# Patient Record
Sex: Female | Born: 1940 | Race: White | Hispanic: No | Marital: Married | Smoking: Never smoker
Health system: Southern US, Community
[De-identification: ages and names within clinical notes are randomized; demographics above are authoritative.]

## PROBLEM LIST (undated history)

## (undated) DIAGNOSIS — E079 Disorder of thyroid, unspecified: Secondary | ICD-10-CM

## (undated) DIAGNOSIS — I10 Essential (primary) hypertension: Secondary | ICD-10-CM

---

## 2016-09-26 ENCOUNTER — Emergency Department (HOSPITAL_COMMUNITY)
Admission: EM | Admit: 2016-09-26 | Discharge: 2016-09-26 | Disposition: A | Discharge status: Home / Self Care | Payer: Medicare PPO | Attending: Emergency Medicine | Admitting: Emergency Medicine

## 2016-09-26 ENCOUNTER — Emergency Department (HOSPITAL_COMMUNITY): Payer: Medicare PPO

## 2016-09-26 ENCOUNTER — Encounter (HOSPITAL_COMMUNITY): Payer: Self-pay | Admitting: *Deleted

## 2016-09-26 DIAGNOSIS — W109XXA Fall (on) (from) unspecified stairs and steps, initial encounter: Secondary | ICD-10-CM | POA: Diagnosis not present

## 2016-09-26 DIAGNOSIS — S0990XA Unspecified injury of head, initial encounter: Secondary | ICD-10-CM | POA: Diagnosis present

## 2016-09-26 DIAGNOSIS — Y9289 Other specified places as the place of occurrence of the external cause: Secondary | ICD-10-CM | POA: Insufficient documentation

## 2016-09-26 DIAGNOSIS — Z79899 Other long term (current) drug therapy: Secondary | ICD-10-CM | POA: Diagnosis not present

## 2016-09-26 DIAGNOSIS — S0083XA Contusion of other part of head, initial encounter: Secondary | ICD-10-CM | POA: Diagnosis not present

## 2016-09-26 DIAGNOSIS — Y999 Unspecified external cause status: Secondary | ICD-10-CM | POA: Diagnosis not present

## 2016-09-26 DIAGNOSIS — S80211A Abrasion, right knee, initial encounter: Secondary | ICD-10-CM | POA: Diagnosis not present

## 2016-09-26 DIAGNOSIS — I10 Essential (primary) hypertension: Secondary | ICD-10-CM | POA: Insufficient documentation

## 2016-09-26 DIAGNOSIS — Y939 Activity, unspecified: Secondary | ICD-10-CM | POA: Insufficient documentation

## 2016-09-26 HISTORY — DX: Essential (primary) hypertension: I10

## 2016-09-26 HISTORY — DX: Disorder of thyroid, unspecified: E07.9

## 2016-09-26 MED ORDER — ACETAMINOPHEN 500 MG PO TABS
1000.0000 mg | ORAL_TABLET | Freq: Once | ORAL | Status: AC
  Administered 2016-09-26: 1000 mg via ORAL
  Filled 2016-09-26: qty 2

## 2016-09-26 MED ORDER — NAPROXEN 500 MG PO TABS
500.0000 mg | ORAL_TABLET | Freq: Once | ORAL | Status: AC
  Administered 2016-09-26: 500 mg via ORAL
  Filled 2016-09-26: qty 1

## 2016-09-26 NOTE — ED Provider Notes (Signed)
[facility]-EMERGENCY DEPT Provider Note   CSN: [id number] Arrival date & time: 09/26/16  1559     History   Chief Complaint Chief Complaint  Patient presents with  . Head Injury    HPI Heather Sheppard is a 75 y.o. female.   Head Injury   Incident onset: 230pm. She came to the ER via walk-in. The injury mechanism was a fall. There was no loss of consciousness. There was no blood loss. The pain is moderate. The pain has been constant since the injury. She was found conscious by EMS personnel. She has tried ice for the symptoms. The treatment provided mild relief.    Past Medical History:  Diagnosis Date  . Hypertension   . Thyroid disease     There are no active problems to display for this patient.   History reviewed. No pertinent surgical history.  OB History    No data available       Home Medications    Prior to Admission medications   Medication Sig Start Date End Date Taking? Authorizing Provider  buPROPion (WELLBUTRIN SR) 200 MG 12 hr tablet Take 200 mg by mouth 2 (two) times daily.   Yes Historical Provider, MD  levothyroxine (SYNTHROID, LEVOTHROID) 112 MCG tablet Take 112 mcg by mouth daily before breakfast.   Yes Historical Provider, MD  metoprolol succinate (TOPROL-XL) 50 MG 24 hr tablet Take 50 mg by mouth every evening. Take with or immediately following a meal.    Yes Historical Provider, MD  mirabegron ER (MYRBETRIQ) 25 MG TB24 tablet Take 25 mg by mouth every evening.   Yes Historical Provider, MD  tiZANidine (ZANAFLEX) 4 MG tablet Take 4 mg by mouth every 8 (eight) hours as needed for muscle spasms.   Yes Historical Provider, MD    Family History No family history on file.  Social History Social History  Substance Use Topics  . Smoking status: Never Smoker  . Smokeless tobacco: Never Used  . Alcohol use Yes     Allergies   Morphine and related; Protonix [pantoprazole sodium]; Sulfa antibiotics; Tagamet [cimetidine]; and Zantac [ranitidine  hcl]   Review of Systems Review of Systems  All other systems reviewed and are negative.    Physical Exam Updated Vital Signs BP 155/76 (BP Location: Left Arm)   Pulse 76   Temp 97.6 F (36.4 C) (Oral)   Resp 20   SpO2 98%   Physical Exam  Constitutional: She is oriented to person, place, and time. She appears well-developed and well-nourished. No distress.  HENT:  Head: Normocephalic. Head is with contusion.    Nose: Nose normal.  Tenderness over ledt zygomatic arch, upper and lower orbit with painless EOMI and no entrament  Eyes: Conjunctivae and EOM are normal. Pupils are equal, round, and reactive to light.  Neck: Neck supple. No tracheal deviation present.  Cardiovascular: Normal rate, regular rhythm and normal heart sounds.   Pulmonary/Chest: Effort normal. No respiratory distress.  Abdominal: Soft. She exhibits no distension.  Musculoskeletal: She exhibits no deformity.  Right knee abrasion  Neurological: She is alert and oriented to person, place, and time.  Skin: Skin is warm and dry.  Psychiatric: She has a normal mood and affect.     ED Treatments / Results  Labs (all labs ordered are listed, but only abnormal results are displayed) Labs Reviewed - No data to display  EKG  EKG Interpretation None       Radiology Ct Head Wo Contrast  Result Date:  09/26/2016 CLINICAL DATA:  Swollen bruise area to left thigh and forehead since falling going up the stairs today. EXAM: CT HEAD WITHOUT CONTRAST CT CERVICAL SPINE WITHOUT CONTRAST TECHNIQUE: Multidetector CT imaging of the head and cervical spine was performed following the standard protocol without intravenous contrast. Multiplanar CT image reconstructions of the cervical spine were also generated. COMPARISON:  None. FINDINGS: CT HEAD FINDINGS Brain: There is no evidence for acute hemorrhage, hydrocephalus, mass lesion, or abnormal extra-axial fluid collection. No definite CT evidence for acute infarction.  Diffuse loss of parenchymal volume is consistent with atrophy. Patchy low attenuation in the deep hemispheric and periventricular white matter is nonspecific, but likely reflects chronic microvascular ischemic demyelination. Vascular: Atherosclerotic calcification is visualized in the carotid arteries. No dense MCA sign. Major dural sinuses are unremarkable. Skull: No evidence for fracture. No worrisome lytic or sclerotic lesion. Sinuses/Orbits: Chronic mucosal disease is seen in the sphenoid sinuses remaining visualized paranasal sinuses and mastoid air cells are clear. Visualized portions of the globes and intraorbital fat are unremarkable. Other: None. CT CERVICAL SPINE FINDINGS Alignment: Straightening of the normal cervical lordosis noted. Skull base and vertebrae: Imaging from the skull base down to T2 shows no fracture. Soft tissues and spinal canal: No prevertebral fluid or swelling. No visible canal hematoma. Disc levels: Loss of disc height with endplate degeneration is seen at C5-6. Loss of disc height also visible at C6-7 in C4-5. 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 Upper chest: Biapical pleural-parenchymal scarring identified in the lungs. Other: None. IMPRESSION: 1. No acute intracranial abnormality. 2. Atrophy with chronic small vessel white matter ischemic disease. 3. Degenerative changes without fracture in the cervical spine. Electronically Signed   By: Kennith CenterEric  Mansell M.D.   On: 09/26/2016 19:25   Ct Cervical Spine Wo Contrast  Result Date: 09/26/2016 CLINICAL DATA:  Swollen bruise area to left thigh and forehead since falling going up the stairs today. EXAM: CT HEAD WITHOUT CONTRAST CT CERVICAL SPINE WITHOUT CONTRAST TECHNIQUE: Multidetector CT imaging of the head and cervical spine was performed following the standard protocol without intravenous contrast. Multiplanar CT image reconstructions of the cervical spine were also generated. COMPARISON:  None.  FINDINGS: CT HEAD FINDINGS Brain: There is no evidence for acute hemorrhage, hydrocephalus, mass lesion, or abnormal extra-axial fluid collection. No definite CT evidence for acute infarction. Diffuse loss of parenchymal volume is consistent with atrophy. Patchy low attenuation in the deep hemispheric and periventricular white matter is nonspecific, but likely reflects chronic microvascular ischemic demyelination. Vascular: Atherosclerotic calcification is visualized in the carotid arteries. No dense MCA sign. Major dural sinuses are unremarkable. Skull: No evidence for fracture. No worrisome lytic or sclerotic lesion. Sinuses/Orbits: Chronic mucosal disease is seen in the sphenoid sinuses remaining visualized paranasal sinuses and mastoid air cells are clear. Visualized portions of the globes and intraorbital fat are unremarkable. Other: None. CT CERVICAL SPINE FINDINGS Alignment: Straightening of the normal cervical lordosis noted. Skull base and vertebrae: Imaging from the skull base down to T2 shows no fracture. Soft tissues and spinal canal: No prevertebral fluid or swelling. No visible canal hematoma. Disc levels: Loss of disc height with endplate degeneration is seen at C5-6. Loss of disc height also visible at C6-7 in C4-5. 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 Upper chest: Biapical pleural-parenchymal scarring identified in the lungs. Other: None. IMPRESSION: 1. No acute intracranial abnormality. 2. Atrophy with chronic small vessel white matter ischemic disease. 3.  Degenerative changes without fracture in the cervical spine. Electronically Signed   By: Kennith CenterEric  Mansell M.D.   On: 09/26/2016 19:25   Ct Maxillofacial Wo Contrast  Result Date: 09/26/2016 CLINICAL DATA:  Fall on stairs today with left facial bruising. EXAM: CT MAXILLOFACIAL WITHOUT CONTRAST TECHNIQUE: Multidetector CT imaging of the maxillofacial structures was performed. Multiplanar CT image reconstructions  were also generated. A small metallic BB was placed on the right temple in order to reliably differentiate right from left. COMPARISON:  Head and cervical spine CT earlier this day at 1910 hour FINDINGS: Osseous: No facial bone fracture. The mandibles, nasal bone, zygomatic arches and pterygoid plates are intact. Normal TMJ alignment. Orbits: No orbital fracture. Left periorbital edema, both globes are intact. No orbital inflammation. Sinuses: Small fluid level in right maxillary sinus, mucosal thickening of left maxillary sinus. Fluid level with bubbly debris within the sphenoid sinus. Soft tissues: Left periorbital soft tissue edema and hematoma. No radiopaque foreign body. Limited intracranial: No change from dedicated head CT earlier this day. No acute abnormality. IMPRESSION: Left periorbital edema and hematoma. No orbital or facial bone fracture. Paranasal sinus disease. Electronically Signed   By: Rubye OaksMelanie  Ehinger M.D.   On: 09/26/2016 22:02    Procedures Procedures (including critical care time)  Medications Ordered in ED Medications - No data to display   Initial Impression / Assessment and Plan / ED Course  I have reviewed the triage vital signs and the nursing notes.  Pertinent labs & imaging results that were available during my care of the patient were reviewed by me and considered in my medical decision making (see chart for details).  Clinical Course     75 y.o. female presents with fall from standing onto concrete while playing with grandson. Large left facial contusion with associated hematoma and tenderness over zygoma/orbital rim. No associated fracture or ICH. Not anticoagulated. Ice and NSAIDs for definitive treatment to resolution. No signs of concussion clinically, Pt to monitor symptoms and slow return to activity.   Final Clinical Impressions(s) / ED Diagnoses   Final diagnoses:  Facial hematoma, initial encounter  Contusion of face, initial encounter    New  Prescriptions Discharge Medication List as of 09/26/2016 10:08 PM       Lyndal Pulleyaniel Kayelynn Abdou, MD 09/27/16 [phone]

## 2016-09-26 NOTE — ED Notes (Signed)
Documented in error CT Maxillofacial wo Contrast.

## 2016-09-26 NOTE — ED Triage Notes (Signed)
Pt has swollen, bruised area to left of eye and forehead since falling going up the stairs today. Pt denies loss of consciousness, use of blood thinners, blurred vision or headache

## 2018-08-15 IMAGING — CT CT HEAD W/O CM
3 of 8 series · 14 of 47 positions shown, 17 images · non-contrast
Comparison: None.

CLINICAL DATA: Swollen bruise area to left thigh and forehead since
falling going up the stairs today.

EXAM:
CT HEAD WITHOUT CONTRAST
CT CERVICAL SPINE WITHOUT CONTRAST
TECHNIQUE: Multidetector CT imaging of the head and cervical spine was
performed following the standard protocol without intravenous
contrast. Multiplanar CT image reconstructions of the cervical spine
were also generated.

[Series 9: coronal · coronal · 0.30mm/px · 3 of 68 slices shown]
[im 26/68  brain]
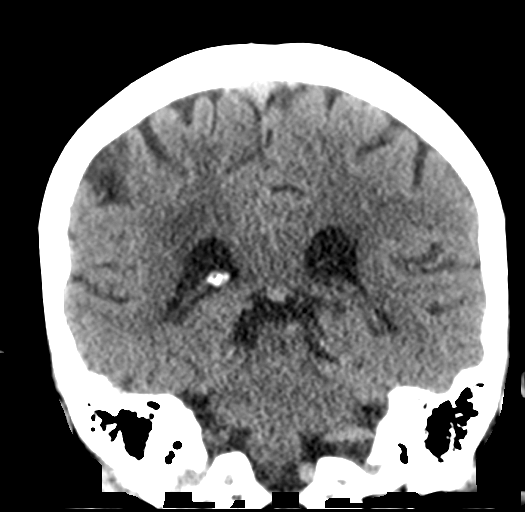
[im 34/68  brain]
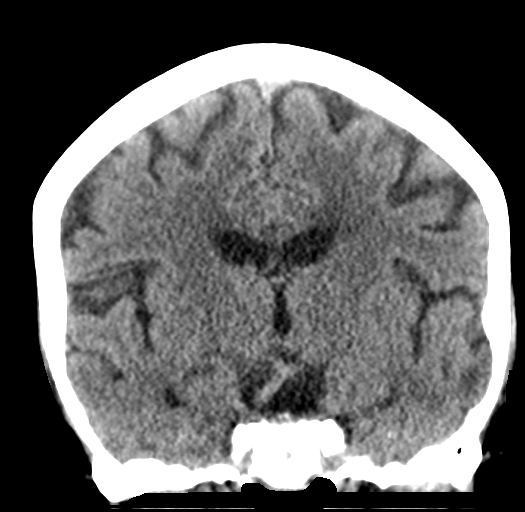
[im 42/68  brain]
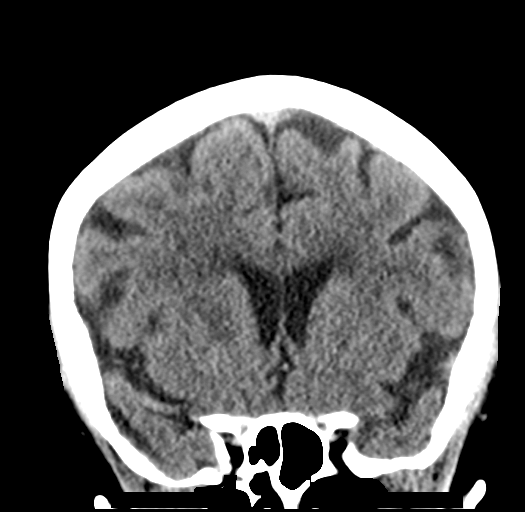

[Series 11: axial recon · axial · 0.23mm/px · z∈[-290,-148]mm · 9 of 98 slices shown, 12 images]
[im 10/98  brain]
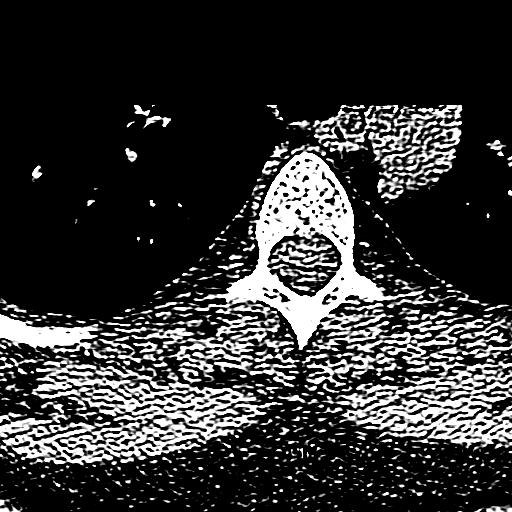
[im 10/98  bone]
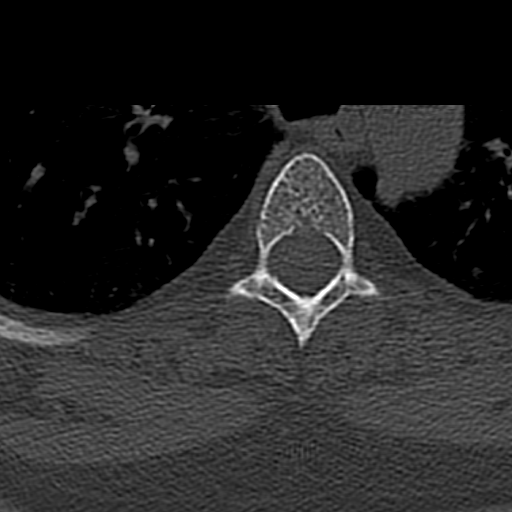
[im 20/98  brain]
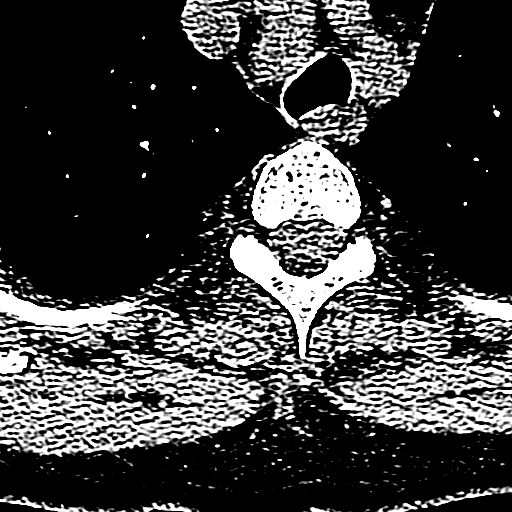
[im 30/98  brain]
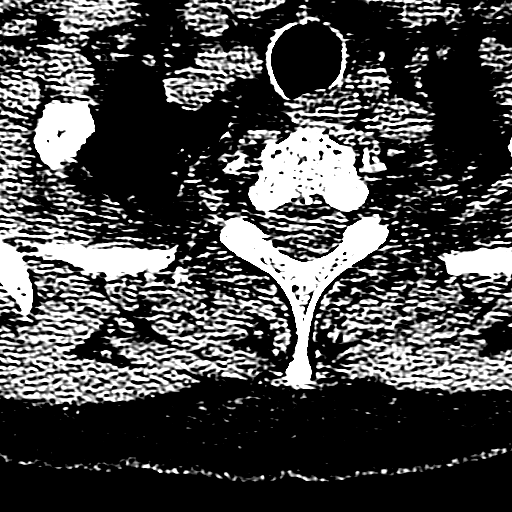
[im 39/98  brain]
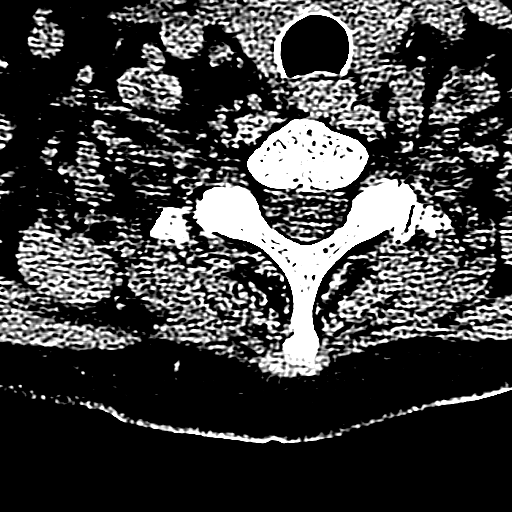
[im 49/98  brain]
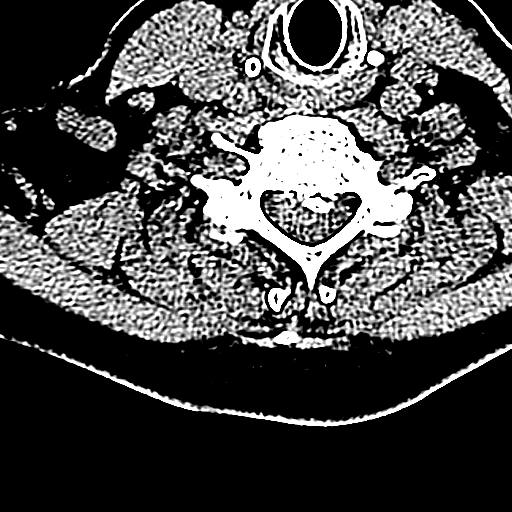
[im 49/98  bone]
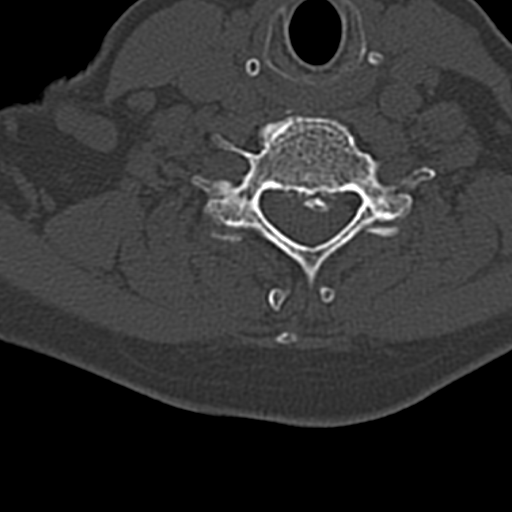
[im 59/98  brain]
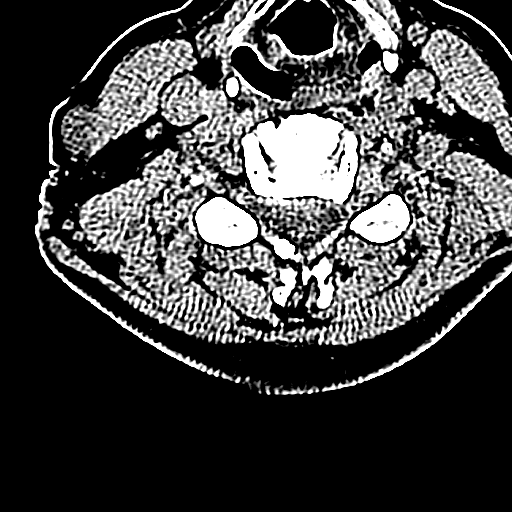
[im 68/98  brain]
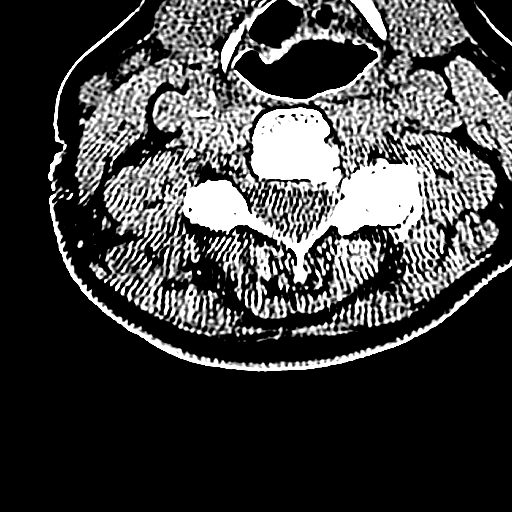
[im 78/98  brain]
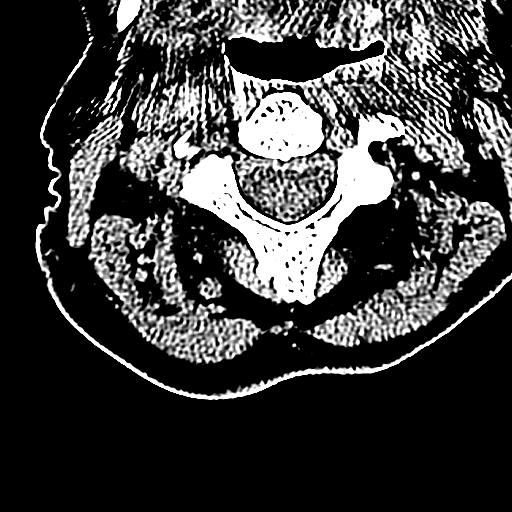
[im 88/98  brain]
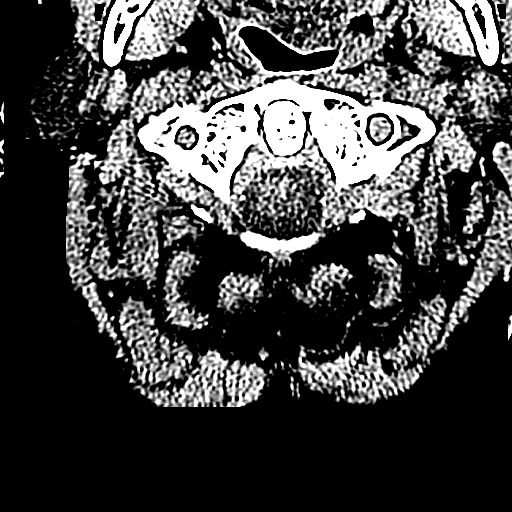
[im 88/98  bone]
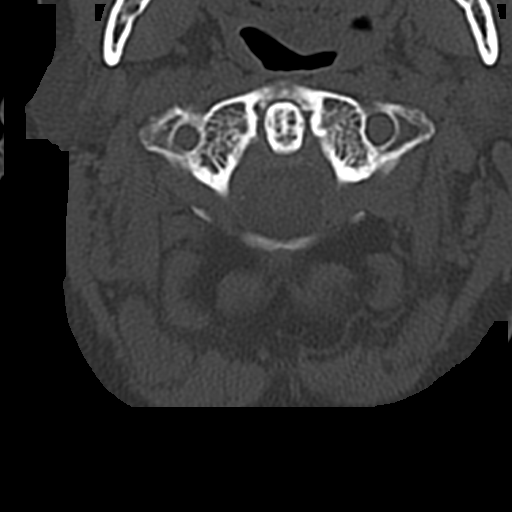

[Series 13: sagittal · sagittal · 0.32mm/px · 2 of 61 slices shown]
[im 21/61  brain]
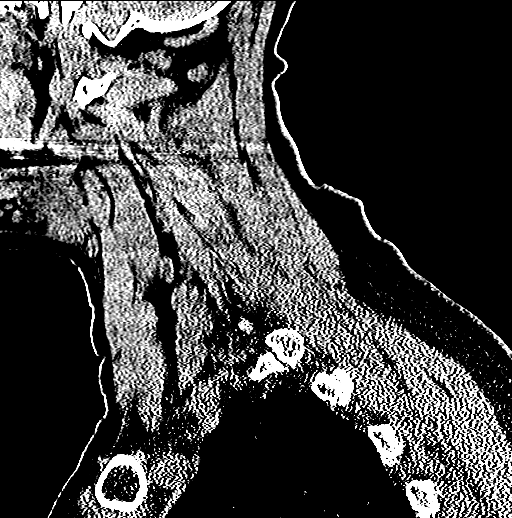
[im 41/61  brain]
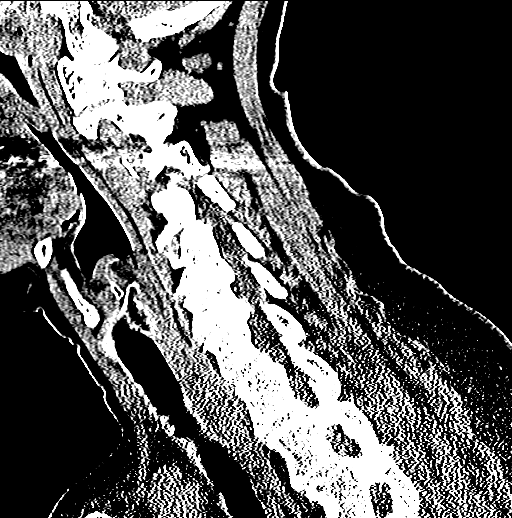

[14 of 47 positions shown; findings below may reference images not displayed]

FINDINGS: CT HEAD FINDINGS

Brain: There is no evidence for acute hemorrhage, hydrocephalus,
mass lesion, or abnormal extra-axial fluid collection. No definite
CT evidence for acute infarction. Diffuse loss of parenchymal volume
is consistent with atrophy. Patchy low attenuation in the deep
hemispheric and periventricular white matter is nonspecific, but
likely reflects chronic microvascular ischemic demyelination.

Vascular: Atherosclerotic calcification is visualized in the carotid
arteries. No dense MCA sign. Major dural sinuses are unremarkable.

Skull: No evidence for fracture. No worrisome lytic or sclerotic
lesion.

Sinuses/Orbits: Chronic mucosal disease is seen in the sphenoid
sinuses remaining visualized paranasal sinuses and mastoid air cells
are clear. Visualized portions of the globes and intraorbital fat
are unremarkable.

Other: None.

CT CERVICAL SPINE FINDINGS

Alignment: Straightening of the normal cervical lordosis noted.

Skull base and vertebrae: Imaging from the skull base down to T2
shows no fracture.

Soft tissues and spinal canal: No prevertebral fluid or swelling. No
visible canal hematoma.

Disc levels: Loss of disc height with endplate degeneration is seen
at C5-6. Loss of disc height also visible at C6-7 in C4-5. Facet
osteoarthritis is most prominent on the left at C3-4 and C7-T1.

Upper chest: Biapical pleural-parenchymal scarring identified in the
lungs.

Other: None.
IMPRESSION: 1. No acute intracranial abnormality.
2. Atrophy with chronic small vessel white matter ischemic disease.
3. Degenerative changes without fracture in the cervical spine.
